# Patient Record
Sex: Female | Born: 1935 | Race: White | Hispanic: No | Smoking: Never smoker
Health system: Southern US, Community
[De-identification: ages and names within clinical notes are randomized; demographics above are authoritative.]

## PROBLEM LIST (undated history)

## (undated) DIAGNOSIS — I10 Essential (primary) hypertension: Secondary | ICD-10-CM

## (undated) DIAGNOSIS — M810 Age-related osteoporosis without current pathological fracture: Secondary | ICD-10-CM

## (undated) DIAGNOSIS — N816 Rectocele: Secondary | ICD-10-CM

## (undated) DIAGNOSIS — IMO0002 Reserved for concepts with insufficient information to code with codable children: Secondary | ICD-10-CM

## (undated) DIAGNOSIS — I4891 Unspecified atrial fibrillation: Secondary | ICD-10-CM

## (undated) DIAGNOSIS — H3530 Unspecified macular degeneration: Secondary | ICD-10-CM

## (undated) HISTORY — PX: CHOLECYSTECTOMY: SHX55

## (undated) HISTORY — DX: Essential (primary) hypertension: I10

## (undated) HISTORY — DX: Rectocele: N81.6

## (undated) HISTORY — DX: Age-related osteoporosis without current pathological fracture: M81.0

## (undated) HISTORY — PX: SHOULDER SURGERY: SHX246

## (undated) HISTORY — DX: Reserved for concepts with insufficient information to code with codable children: IMO0002

## (undated) HISTORY — PX: ANTERIOR AND POSTERIOR REPAIR: SHX1172

## (undated) HISTORY — PX: VAGINAL HYSTERECTOMY: SUR661

## (undated) HISTORY — DX: Unspecified atrial fibrillation: I48.91

## (undated) HISTORY — PX: TUBAL LIGATION: SHX77

## (undated) HISTORY — PX: BREAST SURGERY: SHX581

## (undated) HISTORY — DX: Unspecified macular degeneration: H35.30

---

## 1998-02-08 ENCOUNTER — Other Ambulatory Visit: Admission: RE | Admit: 1998-02-08 | Discharge: 1998-02-08 | Payer: Self-pay | Admitting: Obstetrics and Gynecology

## 1998-06-17 ENCOUNTER — Ambulatory Visit (HOSPITAL_COMMUNITY): Admission: RE | Admit: 1998-06-17 | Discharge: 1998-06-17 | Payer: Self-pay | Admitting: Neurosurgery

## 1998-06-17 ENCOUNTER — Encounter: Payer: Self-pay | Admitting: Neurosurgery

## 1998-07-03 ENCOUNTER — Ambulatory Visit (HOSPITAL_COMMUNITY): Admission: RE | Admit: 1998-07-03 | Discharge: 1998-07-03 | Payer: Self-pay | Admitting: Neurosurgery

## 1998-07-03 ENCOUNTER — Encounter: Payer: Self-pay | Admitting: Neurosurgery

## 1998-07-17 ENCOUNTER — Encounter: Payer: Self-pay | Admitting: Neurosurgery

## 1998-07-17 ENCOUNTER — Ambulatory Visit (HOSPITAL_COMMUNITY): Admission: RE | Admit: 1998-07-17 | Discharge: 1998-07-17 | Payer: Self-pay | Admitting: Neurosurgery

## 1998-07-31 ENCOUNTER — Ambulatory Visit (HOSPITAL_COMMUNITY): Admission: RE | Admit: 1998-07-31 | Discharge: 1998-07-31 | Payer: Self-pay | Admitting: Neurosurgery

## 1998-07-31 ENCOUNTER — Encounter: Payer: Self-pay | Admitting: Neurosurgery

## 1999-02-12 ENCOUNTER — Other Ambulatory Visit: Admission: RE | Admit: 1999-02-12 | Discharge: 1999-02-12 | Payer: Self-pay | Admitting: Obstetrics and Gynecology

## 1999-07-16 ENCOUNTER — Ambulatory Visit (HOSPITAL_COMMUNITY): Admission: RE | Admit: 1999-07-16 | Discharge: 1999-07-16 | Payer: Self-pay | Admitting: Neurosurgery

## 1999-07-16 ENCOUNTER — Encounter: Payer: Self-pay | Admitting: Neurosurgery

## 1999-08-30 ENCOUNTER — Ambulatory Visit (HOSPITAL_COMMUNITY): Admission: RE | Admit: 1999-08-30 | Discharge: 1999-08-30 | Payer: Self-pay | Admitting: Surgery

## 1999-08-30 ENCOUNTER — Encounter: Payer: Self-pay | Admitting: Surgery

## 1999-09-19 ENCOUNTER — Ambulatory Visit (HOSPITAL_COMMUNITY): Admission: RE | Admit: 1999-09-19 | Discharge: 1999-09-19 | Payer: Self-pay | Admitting: Neurosurgery

## 1999-09-19 ENCOUNTER — Encounter: Payer: Self-pay | Admitting: Neurosurgery

## 1999-10-29 ENCOUNTER — Emergency Department (HOSPITAL_COMMUNITY): Admission: EM | Admit: 1999-10-29 | Discharge: 1999-10-29 | Payer: Self-pay | Admitting: Emergency Medicine

## 2000-02-13 ENCOUNTER — Other Ambulatory Visit: Admission: RE | Admit: 2000-02-13 | Discharge: 2000-02-13 | Payer: Self-pay | Admitting: Obstetrics and Gynecology

## 2000-04-13 ENCOUNTER — Emergency Department (HOSPITAL_COMMUNITY): Admission: EM | Admit: 2000-04-13 | Discharge: 2000-04-13 | Payer: Self-pay

## 2000-05-16 ENCOUNTER — Encounter: Payer: Self-pay | Admitting: Orthopedic Surgery

## 2000-05-16 ENCOUNTER — Encounter: Admission: RE | Admit: 2000-05-16 | Discharge: 2000-05-16 | Payer: Self-pay | Admitting: Orthopedic Surgery

## 2000-05-19 ENCOUNTER — Ambulatory Visit (HOSPITAL_BASED_OUTPATIENT_CLINIC_OR_DEPARTMENT_OTHER): Admission: RE | Admit: 2000-05-19 | Discharge: 2000-05-19 | Payer: Self-pay | Admitting: Orthopedic Surgery

## 2001-02-26 ENCOUNTER — Other Ambulatory Visit: Admission: RE | Admit: 2001-02-26 | Discharge: 2001-02-26 | Payer: Self-pay | Admitting: Obstetrics and Gynecology

## 2001-03-09 ENCOUNTER — Emergency Department (HOSPITAL_COMMUNITY): Admission: EM | Admit: 2001-03-09 | Discharge: 2001-03-10 | Payer: Self-pay | Admitting: Emergency Medicine

## 2001-03-10 ENCOUNTER — Encounter: Payer: Self-pay | Admitting: Emergency Medicine

## 2002-03-01 ENCOUNTER — Other Ambulatory Visit: Admission: RE | Admit: 2002-03-01 | Discharge: 2002-03-01 | Payer: Self-pay | Admitting: Obstetrics and Gynecology

## 2003-03-07 ENCOUNTER — Encounter: Admission: RE | Admit: 2003-03-07 | Discharge: 2003-03-07 | Payer: Self-pay | Admitting: Orthopedic Surgery

## 2003-03-08 ENCOUNTER — Ambulatory Visit (HOSPITAL_BASED_OUTPATIENT_CLINIC_OR_DEPARTMENT_OTHER): Admission: RE | Admit: 2003-03-08 | Discharge: 2003-03-08 | Payer: Self-pay | Admitting: Orthopedic Surgery

## 2003-03-08 ENCOUNTER — Ambulatory Visit (HOSPITAL_COMMUNITY): Admission: RE | Admit: 2003-03-08 | Discharge: 2003-03-08 | Payer: Self-pay | Admitting: Orthopedic Surgery

## 2004-02-21 ENCOUNTER — Ambulatory Visit: Payer: Self-pay | Admitting: Pulmonary Disease

## 2004-03-01 ENCOUNTER — Ambulatory Visit: Payer: Self-pay | Admitting: Pulmonary Disease

## 2004-03-05 ENCOUNTER — Other Ambulatory Visit: Admission: RE | Admit: 2004-03-05 | Discharge: 2004-03-05 | Payer: Self-pay | Admitting: Obstetrics and Gynecology

## 2004-06-05 ENCOUNTER — Ambulatory Visit: Payer: Self-pay | Admitting: Gastroenterology

## 2004-06-14 ENCOUNTER — Ambulatory Visit: Payer: Self-pay | Admitting: Gastroenterology

## 2004-11-06 ENCOUNTER — Ambulatory Visit: Payer: Self-pay | Admitting: Pulmonary Disease

## 2005-01-23 ENCOUNTER — Ambulatory Visit (HOSPITAL_COMMUNITY): Admission: RE | Admit: 2005-01-23 | Discharge: 2005-01-23 | Payer: Self-pay | Admitting: Neurosurgery

## 2005-02-12 ENCOUNTER — Ambulatory Visit: Payer: Self-pay | Admitting: Pulmonary Disease

## 2005-02-27 ENCOUNTER — Ambulatory Visit: Payer: Self-pay | Admitting: Pulmonary Disease

## 2005-03-01 ENCOUNTER — Ambulatory Visit: Payer: Self-pay | Admitting: Pulmonary Disease

## 2005-03-18 ENCOUNTER — Ambulatory Visit: Payer: Self-pay | Admitting: Pulmonary Disease

## 2005-04-25 ENCOUNTER — Ambulatory Visit: Payer: Self-pay | Admitting: Pulmonary Disease

## 2006-01-28 ENCOUNTER — Ambulatory Visit: Payer: Self-pay | Admitting: Pulmonary Disease

## 2006-02-18 ENCOUNTER — Other Ambulatory Visit: Admission: RE | Admit: 2006-02-18 | Discharge: 2006-02-18 | Payer: Self-pay | Admitting: Obstetrics and Gynecology

## 2008-03-01 ENCOUNTER — Encounter: Payer: Self-pay | Admitting: Obstetrics and Gynecology

## 2008-03-01 ENCOUNTER — Other Ambulatory Visit: Admission: RE | Admit: 2008-03-01 | Discharge: 2008-03-01 | Payer: Self-pay | Admitting: Obstetrics and Gynecology

## 2008-03-01 ENCOUNTER — Ambulatory Visit: Payer: Self-pay | Admitting: Obstetrics and Gynecology

## 2009-03-03 ENCOUNTER — Ambulatory Visit: Payer: Self-pay | Admitting: Obstetrics and Gynecology

## 2009-03-03 ENCOUNTER — Other Ambulatory Visit: Admission: RE | Admit: 2009-03-03 | Discharge: 2009-03-03 | Payer: Self-pay | Admitting: Obstetrics and Gynecology

## 2009-03-07 ENCOUNTER — Ambulatory Visit: Payer: Self-pay | Admitting: Obstetrics and Gynecology

## 2009-05-02 ENCOUNTER — Encounter: Admission: RE | Admit: 2009-05-02 | Discharge: 2009-05-02 | Payer: Self-pay | Admitting: Neurosurgery

## 2010-03-05 ENCOUNTER — Ambulatory Visit: Payer: Self-pay | Admitting: Obstetrics and Gynecology

## 2010-03-05 ENCOUNTER — Other Ambulatory Visit: Admission: RE | Admit: 2010-03-05 | Discharge: 2010-03-05 | Payer: Self-pay | Admitting: Obstetrics and Gynecology

## 2010-04-19 ENCOUNTER — Telehealth: Payer: Self-pay | Admitting: Pulmonary Disease

## 2010-05-17 NOTE — Progress Notes (Signed)
Summary: dentist calling- oral surgery today/ coumadin  Phone Note From Other Clinic   Caller: dr Effie Shy- [place] in [place] Call For: Leveon Pelzer Summary of Call: pt is having extractions today (soon). dr has questions re: coumadin. call asap [phone] note: pt last seen by dr Crystallynn Noorani 1/ 11 /07.  Initial call taken by: Tivis Ringer, CNA,  April 19, 2010 10:47 AM  Follow-up for Phone Call        I called Dr. Diamantina Providence office and he states he spoke to someone already about this pt and does not need anythign further from [place]. i did advise that this pt has not been seen here since 2007. Carron Curie CMA  April 19, 2010 12:12 PM

## 2010-08-31 NOTE — Op Note (Signed)
NAME:  [name] [name]                          ACCOUNT NO.:  [id number]   MEDICAL RECORD NO.:  [id number]                   PATIENT TYPE:  AMB   LOCATION:  DSC                                  FACILITY:  [facility]   PHYSICIAN:  Robert A. Thurston Hole, M.D.              DATE OF BIRTH:  1925/01/16   DATE OF PROCEDURE:  03/08/2003  DATE OF DISCHARGE:                                 OPERATIVE REPORT   PREOPERATIVE DIAGNOSIS:  Left shoulder rotator cuff tear.   POSTOPERATIVE DIAGNOSIS:  Left shoulder partial rotator cuff tear.   OPERATION PERFORMED:  Left shoulder partial rotator cuff debridement.   SURGEON:  Elana Alm. Thurston Hole, M.D.   ASSISTANT:  Julien Girt, P.A.   ANESTHESIA:  General.   OPERATIVE TIME:  40 minutes.   COMPLICATIONS:  None.   INDICATIONS FOR PROCEDURE:  [name] is a 77 year old woman who had  previously undergone left shoulder arthroscopy and partial rotator cuff tear  debridement five months ago.  Three months postoperatively she fell,  reinjuring her left shoulder.  Subsequent exam and then subsequent MRI  revealed a complete rotator cuff tear with retraction and because of her  persistent lack of function of the arm, she is now to undergo arthroscopy  with attempted rotator cuff repair.   DESCRIPTION OF PROCEDURE:  [name] was brought to the operating room on  March 08, 2003 after an interscalene block had been placed in the holding  room by anesthesia for postoperative pain control.  She was placed on the  operating table in supine position. After being placed under general  anesthesia, she was placed in a beach chair position.  Her shoulder was  examined under anesthesia.  She had full range of motion and her shoulder  was stable to ligamentous exam.  Her shoulder and arm were prepped using  sterile DuraPrep and draped using sterile technique.  Originally, the  arthroscopy was performed through a posterior arthroscopic portal.  The  arthroscope with  a pump attached was placed and through an anterior portal  an arthroscopic probe was  placed.  On initial inspection,the articular  cartilage, glenohumeral joint showed mild grade 1 and 2 chondromalacia.  Anterior and posterior labrum was intact.  Superior labrum intact.  Biceps  tendon anchor and biceps tendon was intact.  Inferior labrum and anterior  inferior glenohumeral ligament complex was intact.  The rotator cuff was  thoroughly inspected from the articular side but it was still well attached  with no evidence of a significant tear noted.  This was thoroughly probed  multiple times.  The inferior capsular recess was free of pathology.  Lateral arthroscopic portal was made and the subacromial space was entered.  A mild amount of bursitis was resected.  The rotator cuff was somewhat  frayed and this was debrided but it was still well-attached to the greater  tuberosity and again after multiple probing and multiple  very extensive  investigation of this area, there was found to be no evidence of a complete  tear.  There was no impingement as she had had a previous subacromial  decompression. After the debridement of the partial tear had been carried  out, the instruments were removed.  Portals closed with 3-0 nylon suture.  Sterile dressings and a sling applied and the patient awakened and taken to  the recovery room in stable condition.   FOLLOW UP:  [name] will be followed as an outpatient on Vicodin or  Percocet for pain.  See her back in the office in a week for suture removal  and follow-up.                                               Robert A. Thurston Hole, M.D.    RAW/MEDQ  D:  03/08/2003  T:  03/09/2003  Job:  [id number]

## 2010-08-31 NOTE — Op Note (Signed)
[place]  Patient:    [name] [name], [name] [name]                       MRN: [id number] Proc. Date: 05/19/00 Adm. Date:  [id number] Disc. Date: [id number] Attending:  Earline Mayotte R                           Operative Report  PREOPERATIVE DIAGNOSES: 1. Right shoulder rotator cuff tear. 2. Right shoulder adhesive capsulitis.  POSTOPERATIVE DIAGNOSES: 1. Right shoulder rotator cuff tear - irreparable rotator cuff tear. 2. Right shoulder adhesive capsulitis. 3. Partial tear biceps tendon.  OPERATION: 1. Right shoulder examination under anesthesia followed by manipulation. 2. Right shoulder arthroscopic debridement of irreparable rotator cuff    tear. 3. Biceps tendon tenolysis.  SURGEON:  Elana Alm. Thurston Hole, M.D.  ASSISTANT:  Kirstin A. Shepperson, P.A.  ANESTHESIA:  General.  OPERATIVE TIME:  30 minutes  COMPLICATIONS:  None.  INDICATION FOR PROCEDURE:  The patient is a 77 year old woman who has had significant right shoulder pain over the past one to two years increasing in nature with signs and symptoms and an MRI documenting a chronic rotator cuff tear and clinical exam consistent with chronic rotator cuff tear and mild adhesive capsulitis who has failed conservative care and is now to undergo arthroscopy.  DESCRIPTION OF PROCEDURE:  The patient is brought to the operating room on May 20, 1999 placed on the operative table in the supine position.  An internal jugular IV was placed because peripheral IV could not be found and this was done without complications by anesthesia.  After this was done, she was placed under general anesthesia.  Her right shoulder was examined under anesthesia, forward flexion to 150, gentle manipulation carried out breaking up soft adhesions and improving forward flexion 180, abduction to 175. Internal and external rotation of 80 degrees.  Shoulder with stable ligamentous exam.  She was placed in a beach chair  position and her shoulder and arm was prepped using sterile Betadine and draped using sterile technique. Originally through a posterior arthroscopic portal, the arthroscope with a pump attachment was placed and through an anterior portal an arthroscopic probe was placed.  On initial inspection, the articular cartilage and the glenohumeral joint showed moderate grade II and 25 to 30% grade III chondromalacia which was debrided.  The anterior and posterior labrum were intact.  The superior labrum was inspected.  It was found to be intact.  The biceps tendon itself had a tear of approximately 75 to 80%. It was not felt to be irreparable and thus this was completely debrided and tenolysed and biceps tendon stump was thoroughly debrided and tenolysed. It was not felt that a repair was necessary nor was a tenodesis necessary.  The rotator cuff showed complete tear with retraction and irreparability of the supraspinatus and infraspinatus and the torn edges were thoroughly debrided.  The subscapularis tendon was intact and the teres minor was intact.  The posterior labrum was intact.  The inferior capsular recess was free of pathology.  A lateral arthroscopic portal was made and further debridement of the torn edges of the chronically torn irreparable rotator cuff was carried out.  A subacromial decompression was not performed due to this large irreparable rotator cuff tear.  After this was done, it was felt that all pathology had been satisfactory addressed.  The instruments were removed.  Portals were closed with 3-0 nylon  suture and injected with 0.25% Marcaine with epinephrine. Sterile dressings were applied.  The patient was awakened and taken to the recovery room in stable condition.  FOLLOW UP CARE:  The patient will be followed as an outpatient on Talwin NX and Naprosyn.  See her back in the office for sutures out and follow up. DD:  05/19/00 TD:  05/19/00 Job: [id number]

## 2011-02-14 ENCOUNTER — Encounter: Payer: Self-pay | Admitting: Obstetrics and Gynecology

## 2011-02-19 ENCOUNTER — Other Ambulatory Visit: Payer: Self-pay | Admitting: *Deleted

## 2011-02-19 DIAGNOSIS — N6489 Other specified disorders of breast: Secondary | ICD-10-CM

## 2011-02-21 ENCOUNTER — Other Ambulatory Visit: Payer: Self-pay | Admitting: *Deleted

## 2011-02-21 DIAGNOSIS — Z139 Encounter for screening, unspecified: Secondary | ICD-10-CM

## 2011-02-22 ENCOUNTER — Other Ambulatory Visit: Payer: Self-pay | Admitting: Obstetrics and Gynecology

## 2011-02-22 DIAGNOSIS — Z139 Encounter for screening, unspecified: Secondary | ICD-10-CM

## 2012-02-28 ENCOUNTER — Encounter: Payer: Self-pay | Admitting: Obstetrics and Gynecology

## 2012-11-30 ENCOUNTER — Encounter: Payer: Self-pay | Admitting: Gynecology

## 2012-12-01 ENCOUNTER — Encounter: Payer: Self-pay | Admitting: Gynecology

## 2012-12-01 ENCOUNTER — Ambulatory Visit (INDEPENDENT_AMBULATORY_CARE_PROVIDER_SITE_OTHER): Payer: Medicare Other | Admitting: Gynecology

## 2012-12-01 VITALS — BP 114/70 | Ht <= 58 in | Wt 146.0 lb

## 2012-12-01 DIAGNOSIS — N952 Postmenopausal atrophic vaginitis: Secondary | ICD-10-CM

## 2012-12-01 DIAGNOSIS — N8111 Cystocele, midline: Secondary | ICD-10-CM

## 2012-12-01 DIAGNOSIS — N816 Rectocele: Secondary | ICD-10-CM

## 2012-12-01 DIAGNOSIS — Z780 Asymptomatic menopausal state: Secondary | ICD-10-CM

## 2012-12-01 NOTE — Progress Notes (Signed)
[name] [name] [birth year] [id number]        79 y.o.  [id number] for followup exam.  Former patient of Dr. Eda Paschal. History of vaginal hysterectomy with A&P repair. Being followed for cystocele/rectocele.  Past medical history,surgical history, medications, allergies, family history and social history were all reviewed and documented in the EPIC chart.  ROS:  Performed and pertinent positives and negatives are included in the history, assessment and plan .  Exam: Kim assistant Filed Vitals:   12/01/12 1417  BP: 114/70  Height: 4\' 9"  (1.448 m)  Weight: 146 lb (66.225 kg)   General appearance  Normal Skin grossly normal Head/Neck normal with no cervical or supraclavicular adenopathy thyroid normal Lungs  clear Cardiac RR, without RMG Abdominal  soft, nontender, without masses, organomegaly or hernia Breasts  examined lying and sitting without masses, retractions, discharge or axillary adenopathy. Pelvic  Ext/BUS/vagina  atrophic changes with second-degree cystocele and second-degree rectocele. Cuff well supported.  Adnexa  Without masses or tenderness    Anus and perineum  normal   Rectovaginal  normal sphincter tone without palpated masses or tenderness.    Assessment/Plan:  79 y.o. [id number] female for followup exam.   1. Rectocele/cystocele. Cuff well supported. Status post vaginal hysterectomy. Asymptomatic. We'll continue to monitor and followup if symptoms develop. 2. Atrophic genital changes. Patient is asymptomatic without significant dryness or irritation. She is not socially active. We'll continue to monitor. 3. Pap smear 2011. No Pap smear done today. No history of abnormal Pap smears previously. Reviewed current screening guidelines and will stop screening as she is over the age of 8 and status post vaginal hysterectomy. 4. Mammography 02/2012. Continue with annual mammography. SBE monthly reviewed. 5. DEXA 2001. Recommended DEXA now as she is at substantial risk of falling  and fracture. Patient agrees to arrange. Increase calcium vitamin D reviewed. 6. Colonoscopy 2011. Repeat at their recommended interval. 7. Health maintenance. No blood work done as it is all done through her other physician's offices. Followup for DEXA otherwise one year, sooner as needed  Note: This document was prepared with digital dictation and possible smart phrase technology. Any transcriptional errors that result from this process are unintentional.   Dara Lords MD, 2:56 PM 12/01/2012

## 2012-12-01 NOTE — Patient Instructions (Signed)
Followup for bone density

## 2012-12-02 LAB — URINALYSIS W MICROSCOPIC + REFLEX CULTURE
Bacteria, UA: NONE SEEN
Bilirubin Urine: NEGATIVE
Casts: NONE SEEN
Crystals: NONE SEEN
Glucose, UA: NEGATIVE mg/dL
Hgb urine dipstick: NEGATIVE
Ketones, ur: NEGATIVE mg/dL
Nitrite: NEGATIVE
Protein, ur: NEGATIVE mg/dL
Specific Gravity, Urine: 1.021 (ref 1.005–1.030)
pH: 8 (ref 5.0–8.0)

## 2013-01-13 DIAGNOSIS — M810 Age-related osteoporosis without current pathological fracture: Secondary | ICD-10-CM

## 2013-01-13 HISTORY — DX: Age-related osteoporosis without current pathological fracture: M81.0

## 2013-02-02 ENCOUNTER — Ambulatory Visit (INDEPENDENT_AMBULATORY_CARE_PROVIDER_SITE_OTHER): Payer: Medicare Other

## 2013-02-02 DIAGNOSIS — Z780 Asymptomatic menopausal state: Secondary | ICD-10-CM

## 2013-02-02 DIAGNOSIS — M810 Age-related osteoporosis without current pathological fracture: Secondary | ICD-10-CM

## 2013-02-04 ENCOUNTER — Encounter: Payer: Self-pay | Admitting: Gynecology

## 2013-02-12 ENCOUNTER — Institutional Professional Consult (permissible substitution): Payer: Medicare Other | Admitting: Gynecology

## 2013-06-10 ENCOUNTER — Ambulatory Visit: Payer: Medicare Other | Admitting: Podiatry

## 2013-06-29 ENCOUNTER — Encounter: Payer: Self-pay | Admitting: Podiatry

## 2013-06-29 ENCOUNTER — Ambulatory Visit (INDEPENDENT_AMBULATORY_CARE_PROVIDER_SITE_OTHER): Payer: Medicare Other | Admitting: Podiatry

## 2013-06-29 VITALS — BP 112/64 | HR 68 | Resp 12

## 2013-06-29 DIAGNOSIS — B351 Tinea unguium: Secondary | ICD-10-CM

## 2013-06-29 DIAGNOSIS — M79609 Pain in unspecified limb: Secondary | ICD-10-CM

## 2013-06-29 DIAGNOSIS — B353 Tinea pedis: Secondary | ICD-10-CM

## 2013-06-29 NOTE — Progress Notes (Signed)
She presents today with a chief complaint of painful E. elongated toenails.  Objective: Vital signs are stable she is alert and oriented x3. Nails are thick yellow dystrophic with mycotic and painful palpation.  Assessment: Pain in limb secondary to onychomycosis 1 through 5 bilateral.  Plan: Debridement of nails 1 through 5 bilateral covered service secondary to pain.

## 2013-07-29 ENCOUNTER — Encounter: Payer: Self-pay | Admitting: Podiatry

## 2013-07-29 ENCOUNTER — Ambulatory Visit (INDEPENDENT_AMBULATORY_CARE_PROVIDER_SITE_OTHER): Payer: Medicare Other | Admitting: Podiatry

## 2013-07-29 VITALS — BP 203/95 | HR 69 | Resp 17 | Ht <= 58 in | Wt 107.0 lb

## 2013-07-29 DIAGNOSIS — B353 Tinea pedis: Secondary | ICD-10-CM

## 2013-07-29 DIAGNOSIS — M79609 Pain in unspecified limb: Secondary | ICD-10-CM

## 2013-07-29 NOTE — Progress Notes (Signed)
She presents today complaining of pain to the plantar aspect of the bilateral foot. I have reviewed her past medical history medications and allergies at this point she has severe fat-pad atrophy and skin atrophy her bones are very prominent she is very frail and she continues to wear her padding to the bilateral foot at this point this is really all she can do.

## 2013-10-07 ENCOUNTER — Ambulatory Visit (INDEPENDENT_AMBULATORY_CARE_PROVIDER_SITE_OTHER): Payer: Medicare Other | Admitting: Podiatry

## 2013-10-07 ENCOUNTER — Encounter: Payer: Self-pay | Admitting: Podiatry

## 2013-10-07 ENCOUNTER — Ambulatory Visit: Payer: Medicare Other | Admitting: Podiatry

## 2013-10-07 DIAGNOSIS — M778 Other enthesopathies, not elsewhere classified: Secondary | ICD-10-CM

## 2013-10-07 DIAGNOSIS — M779 Enthesopathy, unspecified: Secondary | ICD-10-CM

## 2013-10-07 DIAGNOSIS — B351 Tinea unguium: Secondary | ICD-10-CM

## 2013-10-07 DIAGNOSIS — M79609 Pain in unspecified limb: Secondary | ICD-10-CM

## 2013-10-07 DIAGNOSIS — M7750 Other enthesopathy of unspecified foot: Secondary | ICD-10-CM

## 2013-10-07 DIAGNOSIS — M79673 Pain in unspecified foot: Secondary | ICD-10-CM

## 2013-10-07 NOTE — Progress Notes (Signed)
She presents today with a chief complaint of painfully elongated nails 1 through 5 bilateral.  Objective: Nails are thick yellow dystrophic with mycotic painful palpation.  Assessment: Pain in limb secondary to onychomycosis 1 through 5 bilateral.  Plan: Debridement nails thickness and covered service secondary to pain.

## 2014-01-06 ENCOUNTER — Ambulatory Visit (INDEPENDENT_AMBULATORY_CARE_PROVIDER_SITE_OTHER): Payer: Medicare Other | Admitting: Podiatry

## 2014-01-06 DIAGNOSIS — B351 Tinea unguium: Secondary | ICD-10-CM

## 2014-01-06 DIAGNOSIS — M79673 Pain in unspecified foot: Secondary | ICD-10-CM

## 2014-01-06 DIAGNOSIS — G5760 Lesion of plantar nerve, unspecified lower limb: Secondary | ICD-10-CM

## 2014-01-06 DIAGNOSIS — M79609 Pain in unspecified limb: Secondary | ICD-10-CM

## 2014-01-06 DIAGNOSIS — G5762 Lesion of plantar nerve, left lower limb: Secondary | ICD-10-CM

## 2014-01-07 NOTE — Progress Notes (Signed)
Presents today chief complaint of painful elongated toenails one through 5 bilateral.  Objective: Pulses are palpable chest pain on palpation of nails 1 through 5 bilateral thick yellow dystrophic clinically mycotic.  Assessment: Pain in limb secondary to onychomycosis 1 through 5 bilateral.  Plan: Debridement of nails 1 through 5 bilateral.

## 2014-02-14 ENCOUNTER — Encounter: Payer: Self-pay | Admitting: Podiatry

## 2014-03-15 ENCOUNTER — Ambulatory Visit (INDEPENDENT_AMBULATORY_CARE_PROVIDER_SITE_OTHER): Payer: Medicare Other | Admitting: Podiatry

## 2014-03-15 ENCOUNTER — Encounter: Payer: Self-pay | Admitting: Podiatry

## 2014-03-15 VITALS — BP 140/62 | HR 62 | Resp 12

## 2014-03-15 DIAGNOSIS — B351 Tinea unguium: Secondary | ICD-10-CM

## 2014-03-15 DIAGNOSIS — B353 Tinea pedis: Secondary | ICD-10-CM

## 2014-03-15 DIAGNOSIS — M79673 Pain in unspecified foot: Secondary | ICD-10-CM

## 2014-03-15 NOTE — Progress Notes (Signed)
   Subjective:    Patient ID: [name] [name], female    DOB: 03/13/1925, 80 y.o.   MRN: [id number]  HPI   NEW PROBLEM:  PT STATED RT FOOT BETWEEN 1ST AND 2ND TOE IS PAINFUL. THE TOES ARE GETTING WORSE AND GET AGGRAVATED BY PRESSURE. TRIED NEOSPORIN AND HELP SOME.  Review of Systems  Musculoskeletal: Positive for gait problem.  Skin: Positive for color change and rash.  All other systems reviewed and are negative.      Objective:   Physical Exam: Pulses are strongly palpable bilateral. Nails are thick yellow dystrophic with mycotic and painful palpation. She has a superficial skin breakdown however this does not appear to be ulcer appears to be more macerated because of staying with 21st and second toes. They're see no signs of cellulitis.        Assessment & Plan:  Assessment: A limb secondary to onychomycosis and fissure soft tissue between the first and second toes right foot.  Plan: Anesthetic was applied to the incision site discussed soaking in Epsom is also warm water and dried thoroughly. Also debrided nails 1 through 5 bilateral cover service secondary to pain.

## 2014-03-31 ENCOUNTER — Ambulatory Visit: Payer: Medicare Other | Admitting: Podiatry

## 2014-06-14 ENCOUNTER — Encounter: Payer: Self-pay | Admitting: Podiatry

## 2014-06-14 ENCOUNTER — Ambulatory Visit (INDEPENDENT_AMBULATORY_CARE_PROVIDER_SITE_OTHER): Payer: Medicare Other | Admitting: Podiatry

## 2014-06-14 DIAGNOSIS — M79673 Pain in unspecified foot: Secondary | ICD-10-CM

## 2014-06-14 DIAGNOSIS — B351 Tinea unguium: Secondary | ICD-10-CM

## 2014-06-14 NOTE — Progress Notes (Signed)
Presents today chief complaint of painful elongated toenails one through 5 bilateral.  Objective: Pulses are palpable chest pain on palpation of nails 1 through 5 bilateral thick yellow dystrophic clinically mycotic.  Assessment: Pain in limb secondary to onychomycosis 1 through 5 bilateral.  Plan: Debridement of nails 1 through 5 bilateral.

## 2014-09-20 ENCOUNTER — Ambulatory Visit: Payer: Medicare Other | Admitting: Podiatry

## 2014-09-25 ENCOUNTER — Emergency Department (HOSPITAL_BASED_OUTPATIENT_CLINIC_OR_DEPARTMENT_OTHER)
Admission: EM | Admit: 2014-09-25 | Discharge: 2014-09-25 | Disposition: A | Discharge status: Home / Self Care | Payer: Medicare Other | Attending: Emergency Medicine | Admitting: Emergency Medicine

## 2014-09-25 ENCOUNTER — Encounter (HOSPITAL_BASED_OUTPATIENT_CLINIC_OR_DEPARTMENT_OTHER): Payer: Self-pay

## 2014-09-25 DIAGNOSIS — M810 Age-related osteoporosis without current pathological fracture: Secondary | ICD-10-CM | POA: Insufficient documentation

## 2014-09-25 DIAGNOSIS — K5641 Fecal impaction: Secondary | ICD-10-CM | POA: Diagnosis not present

## 2014-09-25 DIAGNOSIS — Z8669 Personal history of other diseases of the nervous system and sense organs: Secondary | ICD-10-CM | POA: Diagnosis not present

## 2014-09-25 DIAGNOSIS — K644 Residual hemorrhoidal skin tags: Secondary | ICD-10-CM | POA: Insufficient documentation

## 2014-09-25 DIAGNOSIS — I10 Essential (primary) hypertension: Secondary | ICD-10-CM | POA: Diagnosis not present

## 2014-09-25 DIAGNOSIS — K5900 Constipation, unspecified: Secondary | ICD-10-CM | POA: Diagnosis present

## 2014-09-25 DIAGNOSIS — Z79899 Other long term (current) drug therapy: Secondary | ICD-10-CM | POA: Diagnosis not present

## 2014-09-25 DIAGNOSIS — Z8742 Personal history of other diseases of the female genital tract: Secondary | ICD-10-CM | POA: Diagnosis not present

## 2014-09-25 DIAGNOSIS — I4891 Unspecified atrial fibrillation: Secondary | ICD-10-CM | POA: Diagnosis not present

## 2014-09-25 MED ORDER — POLYETHYLENE GLYCOL 3350 17 G PO PACK: 17.0000 g | PACK | Freq: Every day | ORAL | Status: AC

## 2014-09-25 MED ORDER — POLYETHYLENE GLYCOL 3350 17 G PO PACK
17.0000 g | PACK | Freq: Every day | ORAL | Status: DC
  Filled 2014-09-25: qty 1

## 2014-09-25 MED ORDER — DOCUSATE SODIUM 100 MG PO CAPS
100.0000 mg | ORAL_CAPSULE | Freq: Two times a day (BID) | ORAL | Status: AC
Start: 2014-09-25 — End: ?

## 2014-09-25 MED ORDER — DOCUSATE SODIUM 100 MG PO CAPS
100.0000 mg | ORAL_CAPSULE | Freq: Once | ORAL | Status: AC
  Administered 2014-09-25: 100 mg via ORAL
  Filled 2014-09-25: qty 1

## 2014-09-25 NOTE — ED Notes (Signed)
Patient here with rectal pain x 2 days. States that she is constipated, no relief with laxative

## 2014-09-25 NOTE — ED Notes (Signed)
Disimpacted by MD.  Patient reports relief after disimpaction

## 2014-09-25 NOTE — ED Notes (Addendum)
Patient reports constipation x 2 days.  Reports last BM was Friday.  Reports passing blood when trying to have a bowel movement which began yesterday.  Denies N/V/D and states she hasn't eaten since yesterday morning when she had a piece of toast.  Reports rectal pain. Denies abdominal pain.

## 2014-09-25 NOTE — Discharge Instructions (Signed)
I recommend youuse Colace 100 mg twice a day and MiraLAX once a day and a full glass of water to help keep your stool soft. You may hold these medications if you're having diarrhea. If you're still unable to have a bowel movement you may use over-the-counter Fleet enemas to help with constipation.  Fecal Impaction A fecal impaction happens when there is a large, firm amount of stool (or feces) that cannot be passed. The impacted stool is usually in the rectum, which is the lowest part of the large bowel. The impacted stool can block the colon and cause significant problems. CAUSES  The longer stool stays in the rectum, the harder it gets. Anything that slows down your bowel movements can lead to fecal impaction, such as:  Constipation. This can be a long-standing (chronic) problem or can happen suddenly (acute).  Painful conditions of the rectum, such as hemorrhoids or anal fissures. The pain of these conditions can make you try to avoid having bowel movements.  Narcotic pain-relieving medicines, such as methadone, morphine, or codeine.  Not drinking enough fluids.  Inactivity and bed rest over long periods of time.  Diseases of the brain or nervous system that damage the nerves controlling the muscles of the intestines. SIGNS AND SYMPTOMS   Lack of normal bowel movements or changes in bowel patterns.  Sense of fullness in the rectum but unable to pass stool.  Pain or cramps in the abdominal area (often after meals).  Thin, watery discharge from the rectum. DIAGNOSIS  Your health care provider may suspect that you have a fecal impaction based on your symptoms and a physical exam. This will include an exam of your rectum. Sometimes X-rays or lab testing may be needed to confirm the diagnosis and to be sure there are no other problems.  TREATMENT   Initially an impaction can be removed manually. Using a gloved finger, your health care provider can remove hard stool from your  rectum.  Medicine is sometimes needed. A suppository or enema can be given in the rectum to soften the stool, which can stimulate a bowel movement. Medicines can also be given by mouth (orally).  Though rare, surgery may be needed if the colon has torn (perforated) due to blockage. HOME CARE INSTRUCTIONS   Develop regular bowel habits. This could include getting in the habit of having a bowel movement after your morning cup of coffee or after eating. Be sure to allow yourself enough time on the toilet.  Maintain a high-fiber diet.  Drink enough fluids to keep your urine clear or pale yellow as directed by your health care provider.  Exercise regularly.  If you begin to get constipated, increase the amount of fiber in your diet. Eat plenty of fruits, vegetables, whole wheat breads, bran, oatmeal, and similar products.  Take natural fiber laxatives or other laxatives only as directed by your health care provider. SEEK MEDICAL CARE IF:   You have ongoing rectal pain.  You require enemas or suppositories more than twice a week.  You have rectal bleeding.  You have continued problems, or you develop abdominal pain.  You have thin, pencil-like stools. SEEK IMMEDIATE MEDICAL CARE IF:  You have black or tarry stools. MAKE SURE YOU:   Understand these instructions.  Will watch your condition.  Will get help right away if you are not doing well or get worse. Document Released: 12/23/2003 Document Revised: 01/20/2013 Document Reviewed: 10/06/2012 [facility] Patient Information 2015 [place], [place]. This information is not intended  to replace advice given to you by your health care provider. Make sure you discuss any questions you have with your health care provider.  Constipation Constipation is when a person has fewer than three bowel movements a week, has difficulty having a bowel movement, or has stools that are dry, hard, or larger than normal. As people grow older, constipation is  more common. If you try to fix constipation with medicines that make you have a bowel movement (laxatives), the problem may get worse. Long-term laxative use may cause the muscles of the colon to become weak. A low-fiber diet, not taking in enough fluids, and taking certain medicines may make constipation worse.  CAUSES   Certain medicines, such as antidepressants, pain medicine, iron supplements, antacids, and water pills.   Certain diseases, such as diabetes, irritable bowel syndrome (IBS), thyroid disease, or depression.   Not drinking enough water.   Not eating enough fiber-rich foods.   Stress or travel.   Lack of physical activity or exercise.   Ignoring the urge to have a bowel movement.   Using laxatives too much.  SIGNS AND SYMPTOMS   Having fewer than three bowel movements a week.   Straining to have a bowel movement.   Having stools that are hard, dry, or larger than normal.   Feeling full or bloated.   Pain in the lower abdomen.   Not feeling relief after having a bowel movement.  DIAGNOSIS  Your health care provider will take a medical history and perform a physical exam. Further testing may be done for severe constipation. Some tests may include:  A barium enema X-ray to examine your rectum, colon, and, sometimes, your small intestine.   A sigmoidoscopy to examine your lower colon.   A colonoscopy to examine your entire colon. TREATMENT  Treatment will depend on the severity of your constipation and what is causing it. Some dietary treatments include drinking more fluids and eating more fiber-rich foods. Lifestyle treatments may include regular exercise. If these diet and lifestyle recommendations do not help, your health care provider may recommend taking over-the-counter laxative medicines to help you have bowel movements. Prescription medicines may be prescribed if over-the-counter medicines do not work.  HOME CARE INSTRUCTIONS   Eat foods  that have a lot of fiber, such as fruits, vegetables, whole grains, and beans.  Limit foods high in fat and processed sugars, such as french fries, hamburgers, cookies, candies, and soda.   A fiber supplement may be added to your diet if you cannot get enough fiber from foods.   Drink enough fluids to keep your urine clear or pale yellow.   Exercise regularly or as directed by your health care provider.   Go to the restroom when you have the urge to go. Do not hold it.   Only take over-the-counter or prescription medicines as directed by your health care provider. Do not take other medicines for constipation without talking to your health care provider first.  SEEK IMMEDIATE MEDICAL CARE IF:   You have bright red blood in your stool.   Your constipation lasts for more than 4 days or gets worse.   You have abdominal or rectal pain.   You have thin, pencil-like stools.   You have unexplained weight loss. MAKE SURE YOU:   Understand these instructions.  Will watch your condition.  Will get help right away if you are not doing well or get worse. Document Released: 12/29/2003 Document Revised: 04/06/2013 Document Reviewed: 01/11/2013 [employer] Patient  Information 2015 [place], [place]. This information is not intended to replace advice given to you by your health care provider. Make sure you discuss any questions you have with your health care provider. High-Fiber Diet Fiber is found in fruits, vegetables, and grains. A high-fiber diet encourages the addition of more whole grains, legumes, fruits, and vegetables in your diet. The recommended amount of fiber for adult males is 38 g per day. For adult females, it is 25 g per day. Pregnant and lactating women should get 28 g of fiber per day. If you have a digestive or bowel problem, ask your caregiver for advice before adding high-fiber foods to your diet. Eat a variety of high-fiber foods instead of only a select few type of  foods.  PURPOSE  To increase stool bulk.  To make bowel movements more regular to prevent constipation.  To lower cholesterol.  To prevent overeating. WHEN IS THIS DIET USED?  It may be used if you have constipation and hemorrhoids.  It may be used if you have uncomplicated diverticulosis (intestine condition) and irritable bowel syndrome.  It may be used if you need help with weight management.  It may be used if you want to add it to your diet as a protective measure against atherosclerosis, diabetes, and cancer. SOURCES OF FIBER  Whole-grain breads and cereals.  Fruits, such as apples, oranges, bananas, berries, prunes, and pears.  Vegetables, such as green peas, carrots, sweet potatoes, beets, broccoli, cabbage, spinach, and artichokes.  Legumes, such split peas, soy, lentils.  Almonds. FIBER CONTENT IN FOODS Starches and Grains / Dietary Fiber (g)  Cheerios, 1 cup / 3 g  Corn Flakes cereal, 1 cup / 0.7 g  Rice crispy treat cereal, 1 cup / 0.3 g  Instant oatmeal (cooked),  cup / 2 g  Frosted wheat cereal, 1 cup / 5.1 g  Brown, long-grain rice (cooked), 1 cup / 3.5 g  White, long-grain rice (cooked), 1 cup / 0.6 g  Enriched macaroni (cooked), 1 cup / 2.5 g Legumes / Dietary Fiber (g)  Baked beans (canned, plain, or vegetarian),  cup / 5.2 g  Kidney beans (canned),  cup / 6.8 g  Pinto beans (cooked),  cup / 5.5 g Breads and Crackers / Dietary Fiber (g)  Plain or honey graham crackers, 2 squares / 0.7 g  Saltine crackers, 3 squares / 0.3 g  Plain, salted pretzels, 10 pieces / 1.8 g  Whole-wheat bread, 1 slice / 1.9 g  White bread, 1 slice / 0.7 g  Raisin bread, 1 slice / 1.2 g  Plain bagel, 3 oz / 2 g  Flour tortilla, 1 oz / 0.9 g  Corn tortilla, 1 small / 1.5 g  Hamburger or hotdog bun, 1 small / 0.9 g Fruits / Dietary Fiber (g)  Apple with skin, 1 medium / 4.4 g  Sweetened applesauce,  cup / 1.5 g  Banana,  medium / 1.5  g  Grapes, 10 grapes / 0.4 g  Orange, 1 small / 2.3 g  Raisin, 1.5 oz / 1.6 g  Melon, 1 cup / 1.4 g Vegetables / Dietary Fiber (g)  Green beans (canned),  cup / 1.3 g  Carrots (cooked),  cup / 2.3 g  Broccoli (cooked),  cup / 2.8 g  Peas (cooked),  cup / 4.4 g  Mashed potatoes,  cup / 1.6 g  Lettuce, 1 cup / 0.5 g  Corn (canned),  cup / 1.6 g  Tomato,  cup /  1.1 g Document Released: 04/01/2005 Document Revised: 10/01/2011 Document Reviewed: 07/04/2011 [facility] Patient Information 2015 [place], [place]. This information is not intended to replace advice given to you by your health care provider. Make sure you discuss any questions you have with your health care provider.

## 2014-09-25 NOTE — ED Notes (Signed)
MD at bedside. 

## 2014-09-25 NOTE — ED Provider Notes (Signed)
TIME SEEN: 10:05 AM  CHIEF COMPLAINT: Fecal impaction  HPI: Pt is a 81 y.o. female who presents to the emergency department with complaints of fecal impaction. States she has not had a bowel movement in 2 days. She is passing gas. No nausea, vomiting or abdominal pain. States she has had rectal pain and has tried laxities without relief. States she has had fecal impactions before and this feels similar.  ROS: See HPI Constitutional: no fever  Eyes: no drainage  ENT: no runny nose   Cardiovascular:  no chest pain  Resp: no SOB  GI: no vomiting GU: no dysuria Integumentary: no rash  Allergy: no hives  Musculoskeletal: no leg swelling  Neurological: no slurred speech ROS otherwise negative  PAST MEDICAL HISTORY/PAST SURGICAL HISTORY:  Past Medical History  Diagnosis Date  . Hypertension   . Macular degeneration   . Cystocele   . Rectocele   . Atrial fibrillation   . Osteoporosis 01/2013    T score -2.5 left forearm, spine and right and left hips normal    MEDICATIONS:  Prior to Admission medications   Medication Sig Start Date End Date Taking? Authorizing Provider  buPROPion (WELLBUTRIN SR) 100 MG 12 hr tablet Take 100 mg by mouth. 08/26/13 08/26/14  Historical Provider, MD  Calcium-Vitamin D (CALTRATE 600 PLUS-VIT D PO) Take by mouth.    Historical Provider, MD  Cholecalciferol (VITAMIN D PO) Take by mouth.    Historical Provider, MD  Dabigatran Etexilate Mesylate (PRADAXA PO) Take by mouth.    Historical Provider, MD  Furosemide (LASIX PO) Take by mouth.    Historical Provider, MD  furosemide (LASIX) 40 MG tablet Take by mouth. 05/24/13   Historical Provider, MD  Gabapentin (NEURONTIN PO) Take by mouth.    Historical Provider, MD  Lisinopril (ZESTRIL PO) Take by mouth.    Historical Provider, MD  Multiple Vitamin (MULTIVITAMIN) tablet Take 1 tablet by mouth daily.    Historical Provider, MD  SOTALOL HCL PO Take by mouth.    Historical Provider, MD  TraMADol HCl (ULTRAM PO)  Take by mouth.    Historical Provider, MD    ALLERGIES:  Allergies  Allergen Reactions  . Codeine   . Sulfa Antibiotics     SOCIAL HISTORY:  History  Substance Use Topics  . Smoking status: Never Smoker   . Smokeless tobacco: Not on file  . Alcohol Use: Yes     Comment: Rare    FAMILY HISTORY: Family History  Problem Relation Age of Onset  . Macular degeneration Mother   . Heart disease Father     EXAM: BP 166/78 mmHg  Pulse 88  Temp(Src) 98.2 F (36.8 C) (Oral)  Resp 20  Ht 4\' 8"  (1.422 m)  Wt 145 lb (65.772 kg)  BMI 32.53 kg/m2  SpO2 93% CONSTITUTIONAL: Alert and oriented and responds appropriately to questions. Well-appearing; well-nourished HEAD: Normocephalic EYES: Conjunctivae clear, PERRL ENT: normal nose; no rhinorrhea; moist mucous membranes; pharynx without lesions noted NECK: Supple, no meningismus, no LAD  CARD: RRR; S1 and S2 appreciated; no murmurs, no clicks, no rubs, no gallops RESP: Normal chest excursion without splinting or tachypnea; breath sounds clear and equal bilaterally; no wheezes, no rhonchi, no rales, no hypoxia or respiratory distress, speaking full sentences ABD/GI: Normal bowel sounds; non-distended; soft, non-tender, no rebound, no guarding, no peritoneal signs RECTAL:  Large amount of hard brown stool in the rectal vault, after disimpaction patient feels much better, small external nonthrombosed nonbleeding hemorrhoids, normal rectal  tone, no melena BACK:  The back appears normal and is non-tender to palpation, there is no CVA tenderness EXT: Normal ROM in all joints; non-tender to palpation; no edema; normal capillary refill; no cyanosis, no calf tenderness or swelling    SKIN: Normal color for age and race; warm NEURO: Moves all extremities equally, sensation to light touch intact diffusely, cranial nerves II through XII intact PSYCH: The patient's mood and manner are appropriate. Grooming and personal hygiene are  appropriate.  MEDICAL DECISION MAKING: Pt here with fecal impaction. Feels much better after disimpaction in the ED. Abdominal exam benign. I doubt bowel obstruction or other intra-abdominal pathology. I feel she is safe to be discharged home. Have advised her to use MiraLAX, Colace and enemas as needed. Discussed increasing water and fiber intake. She verbalized understanding and is comfortable with this plan.     Fecal disimpaction Date/Time: 09/25/2014 10:10 AM Performed by: Raelyn Number Authorized by: Raelyn Number Consent: Verbal consent obtained. Risks and benefits: risks, benefits and alternatives were discussed Consent given by: patient Patient identity confirmed: verbally with patient Local anesthesia used: no Patient sedated: no Patient tolerance: Patient tolerated the procedure well with no immediate complications    Layla Maw Ward, DO 09/25/14 [id number]

## 2014-10-04 ENCOUNTER — Ambulatory Visit (INDEPENDENT_AMBULATORY_CARE_PROVIDER_SITE_OTHER): Payer: Medicare Other | Admitting: Podiatry

## 2014-10-04 ENCOUNTER — Encounter: Payer: Self-pay | Admitting: Podiatry

## 2014-10-04 DIAGNOSIS — M79673 Pain in unspecified foot: Secondary | ICD-10-CM | POA: Diagnosis not present

## 2014-10-04 DIAGNOSIS — B351 Tinea unguium: Secondary | ICD-10-CM

## 2014-10-05 NOTE — Progress Notes (Signed)
She presents to the chief complaint of pain to the plantar aspect of the bilateral foot and painful nails bilaterally.  Objective: Pulses are palpable bilateral. The Bier metatarsalgia bilateral with hammertoe deformities. Nails are thick yellow dystrophic with mycotic and painful palpation. No skin breakdown is noted.  Assessment: Metatarsalgia bilateral and pain in limb secondary to onychomycosis 1 through 5 bilateral.  Plan: Debridement of nails 1 through 5 bilateral covered service secondary to pain.

## 2014-11-02 ENCOUNTER — Other Ambulatory Visit: Payer: Self-pay | Admitting: Nurse Practitioner

## 2014-11-02 ENCOUNTER — Ambulatory Visit
Admission: RE | Admit: 2014-11-02 | Discharge: 2014-11-02 | Disposition: A | Discharge status: Home / Self Care | Payer: Medicare Other | Source: Ambulatory Visit | Attending: Nurse Practitioner | Admitting: Nurse Practitioner

## 2014-11-02 DIAGNOSIS — R200 Anesthesia of skin: Secondary | ICD-10-CM

## 2014-11-02 DIAGNOSIS — R202 Paresthesia of skin: Principal | ICD-10-CM

## 2014-11-08 ENCOUNTER — Ambulatory Visit: Payer: Medicare Other | Admitting: Podiatry

## 2015-01-05 ENCOUNTER — Ambulatory Visit: Payer: Medicare Other | Admitting: Podiatry

## 2015-03-16 DEATH — deceased

## 2015-08-31 IMAGING — CR DG CERVICAL SPINE COMPLETE 4+V
6 series · 6 of 6 positions shown · non-contrast
Comparison: None.

CLINICAL DATA: Numbness of fingers on the right and left hand.

EXAM:
CERVICAL SPINE  4+ VIEWS

[w cervical spine lat]
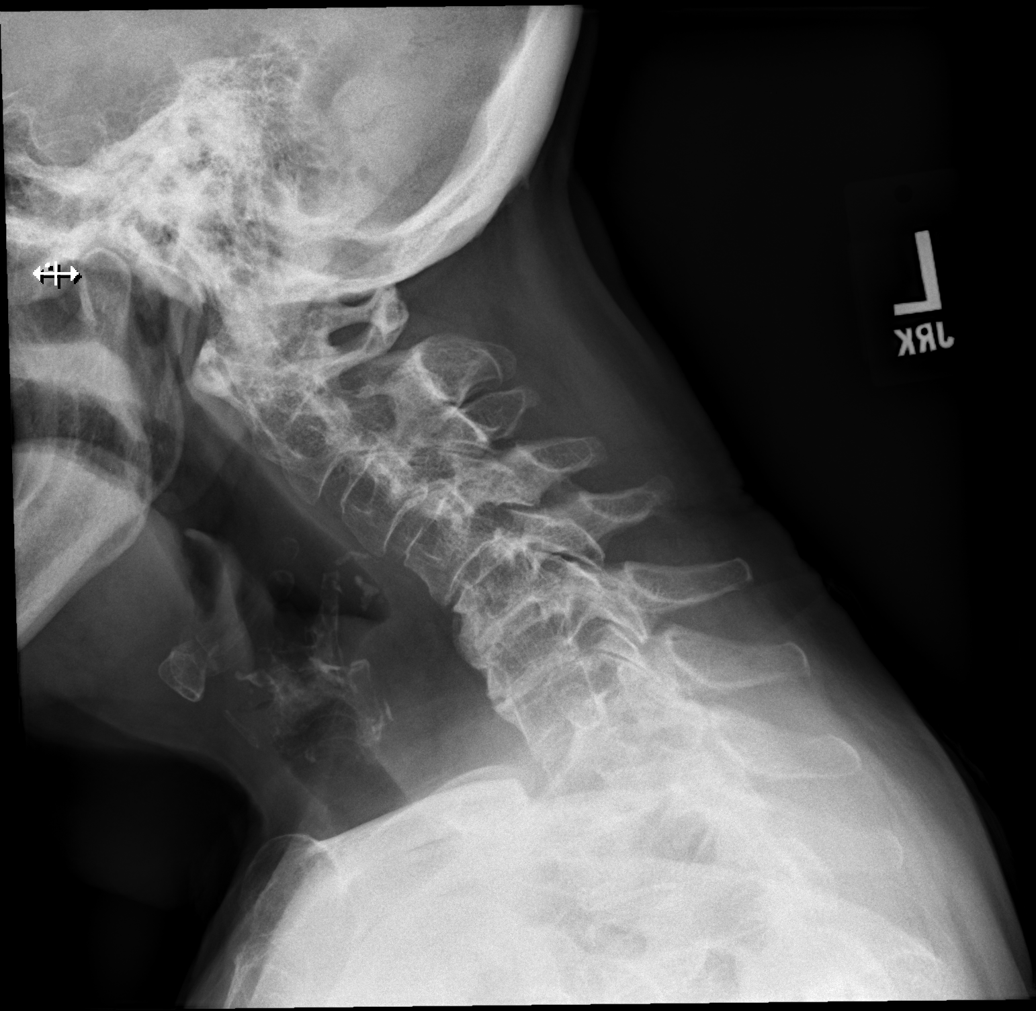

[w cervical swimmers]
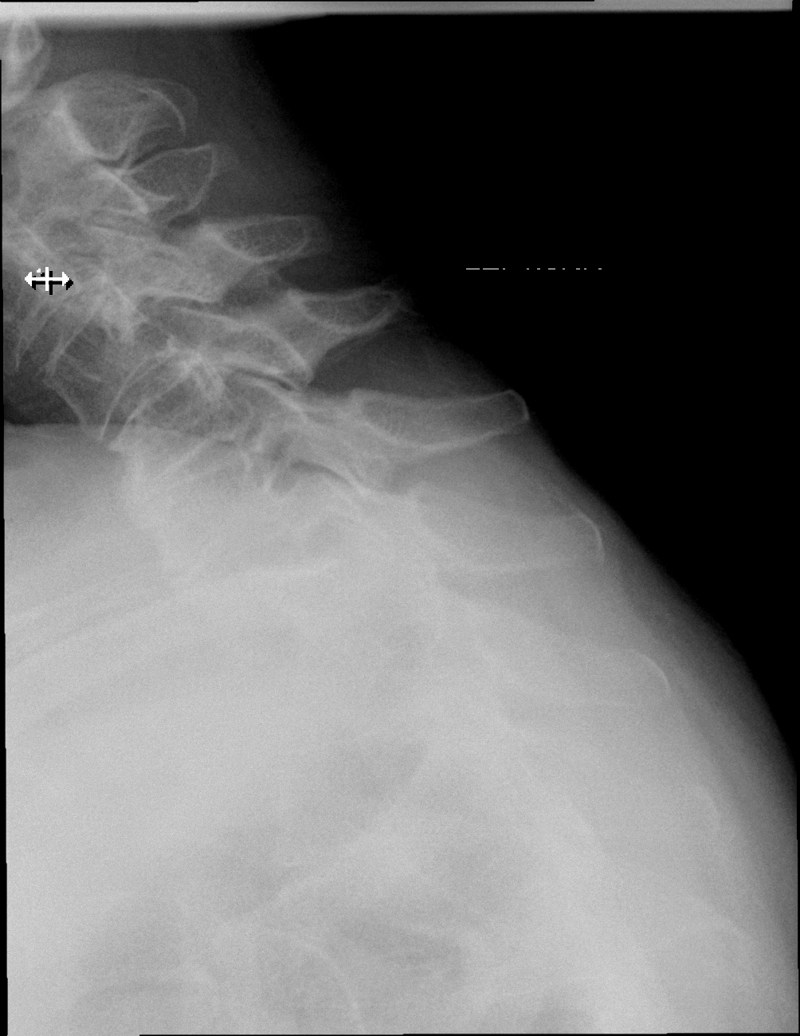

[w cervical spine ap_obl (1 of 2)]
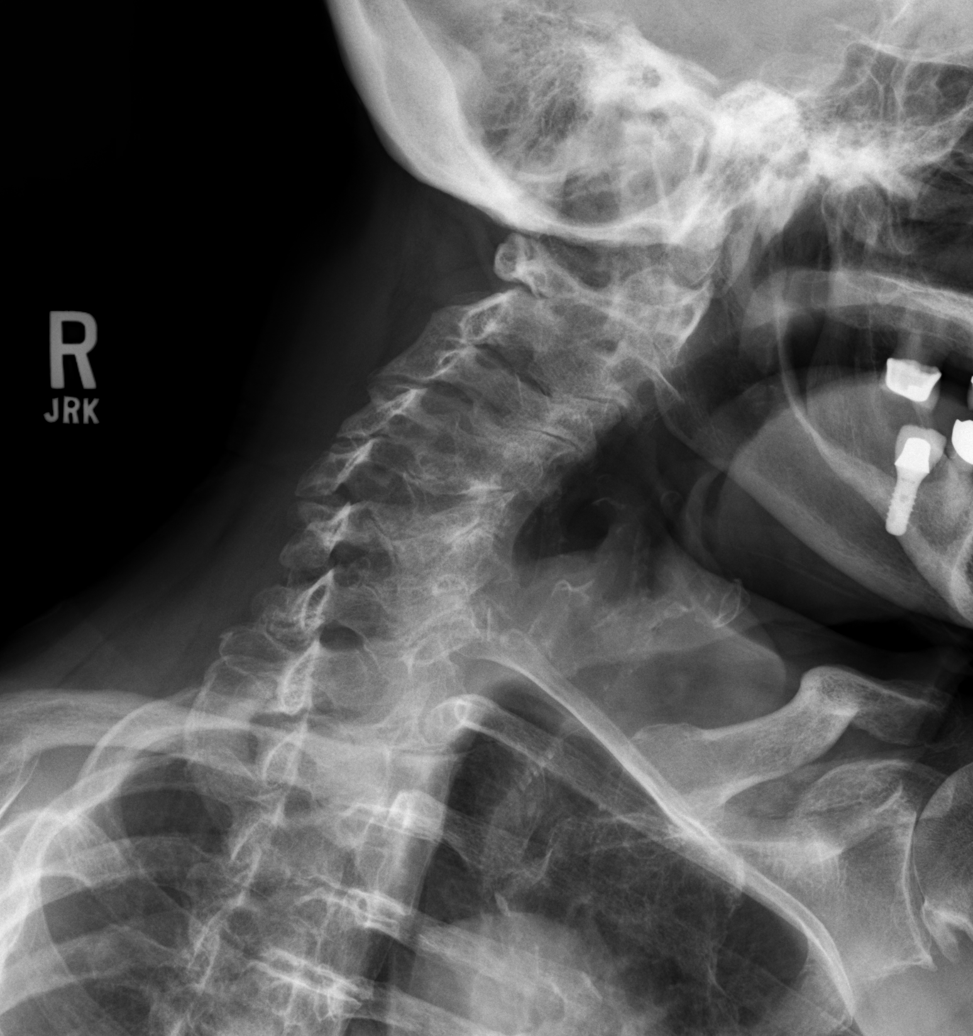

[w cervical spine ap_obl (2 of 2)]
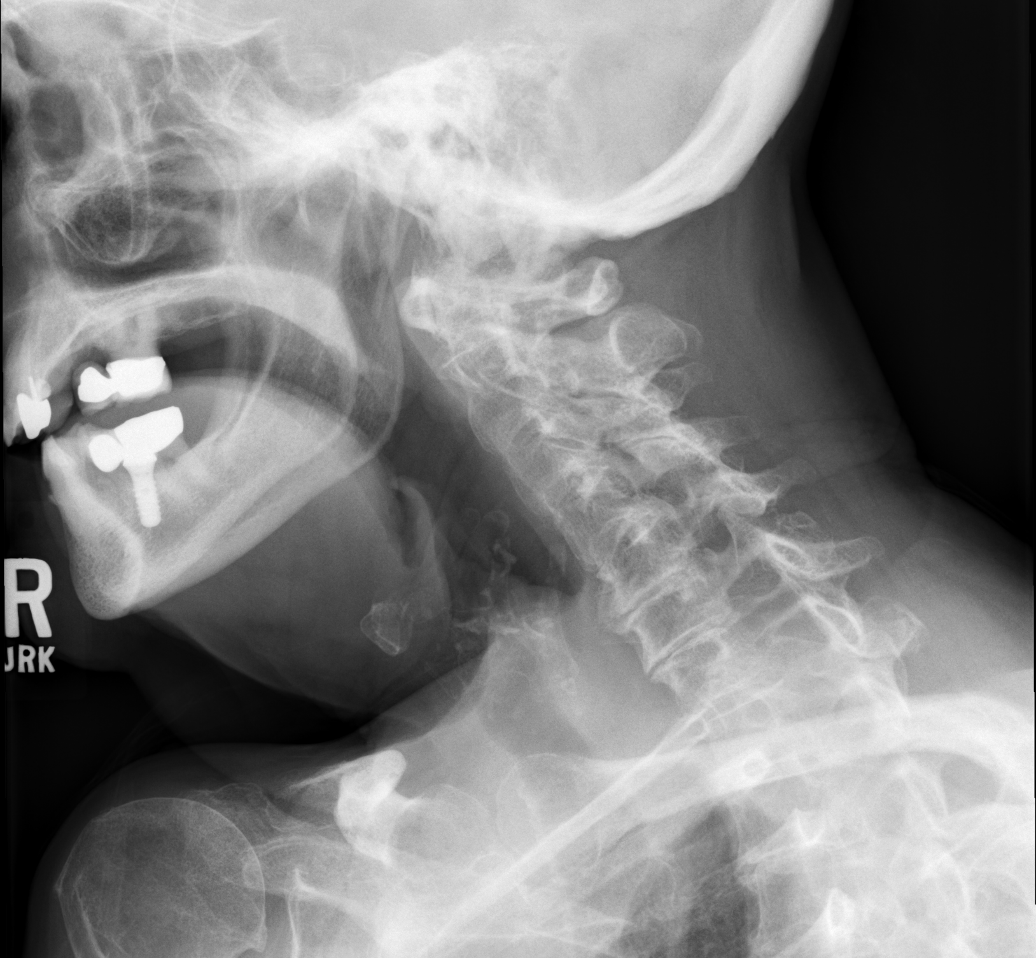

[w cervical spine odontoid]
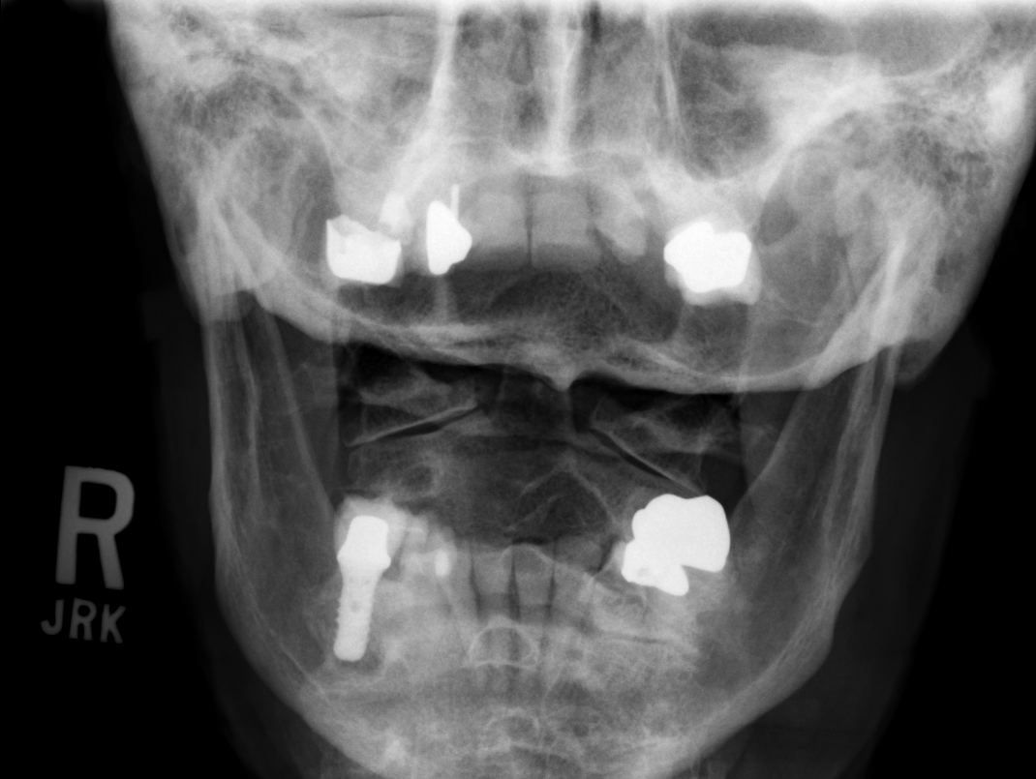

[w cervical spine ap]
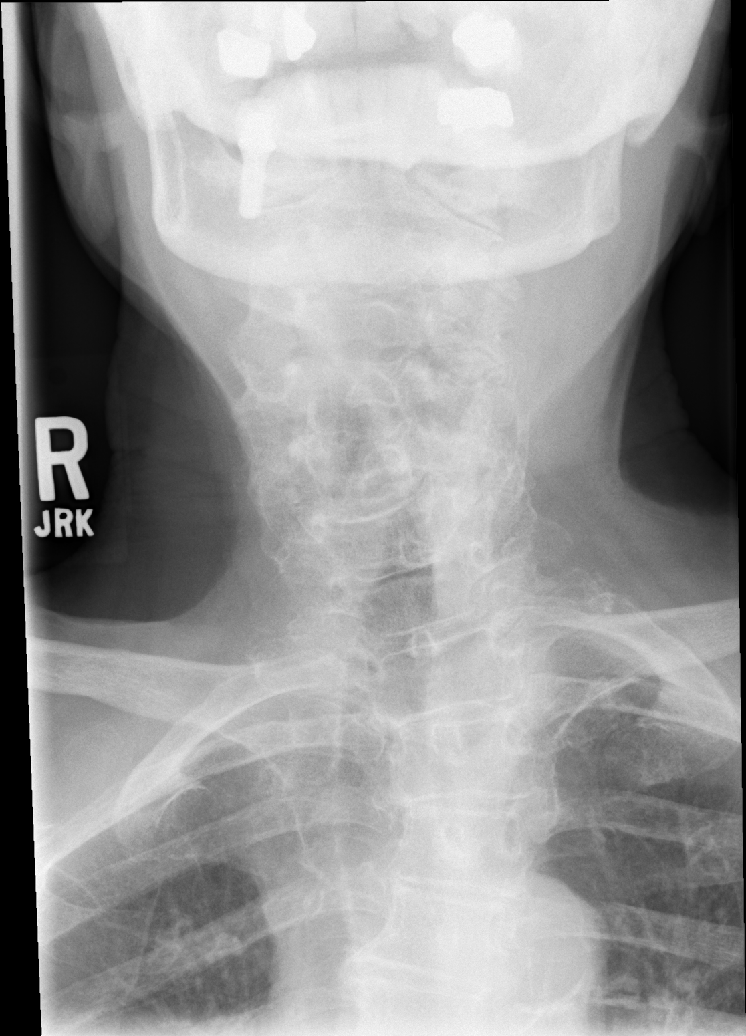

[6 of 6 positions shown; findings below may reference images not displayed]

FINDINGS: Fairly extensive degenerative changes are seen throughout the mid
and lower cervical spine, most prominent at the C5-6 and C6-7
levels, with associated disc space narrowings and osseous spurring.
There is reversal of the normal cervical lordosis which is likely
related to these underlying degenerative changes. Oblique views show
neural foramen encroachment at multiple levels bilaterally.

No fracture line or displaced fracture fragment identified. No acute
- appearing cortical irregularity or osseous lesion. Odontoid view
is symmetric. Posterior elements appear well aligned throughout.
Paravertebral soft tissues are unremarkable.
IMPRESSION: Degenerative changes as detailed above. Suspect associated nerve
root impingement at multiple levels.

Reversal of the normal cervical lordosis likely related to the
underlying degenerative changes.

No acute findings seen.
